# Patient Record
Sex: Female | Born: 1990 | Race: White | Hispanic: No | Marital: Single | Smoking: Never smoker
Health system: Southern US, Community
[De-identification: ages and names within clinical notes are randomized; demographics above are authoritative.]

## PROBLEM LIST (undated history)

## (undated) DIAGNOSIS — G43909 Migraine, unspecified, not intractable, without status migrainosus: Secondary | ICD-10-CM

## (undated) DIAGNOSIS — F419 Anxiety disorder, unspecified: Secondary | ICD-10-CM

## (undated) HISTORY — DX: Migraine, unspecified, not intractable, without status migrainosus: G43.909

## (undated) HISTORY — DX: Anxiety disorder, unspecified: F41.9

---

## 2008-08-08 ENCOUNTER — Ambulatory Visit: Payer: Self-pay | Admitting: Occupational Medicine

## 2008-08-08 DIAGNOSIS — S92309A Fracture of unspecified metatarsal bone(s), unspecified foot, initial encounter for closed fracture: Secondary | ICD-10-CM | POA: Insufficient documentation

## 2008-08-11 ENCOUNTER — Encounter (INDEPENDENT_AMBULATORY_CARE_PROVIDER_SITE_OTHER): Payer: Self-pay | Admitting: Occupational Medicine

## 2008-09-02 ENCOUNTER — Encounter: Admission: RE | Admit: 2008-09-02 | Discharge: 2008-09-02 | Payer: Self-pay | Admitting: Sports Medicine

## 2008-09-16 ENCOUNTER — Encounter: Admission: RE | Admit: 2008-09-16 | Discharge: 2008-09-16 | Payer: Self-pay | Admitting: Sports Medicine

## 2010-07-20 IMAGING — CR DG FOOT COMPLETE 3+V*L*
3 series · 3 of 3 positions shown · non-contrast
Comparison: [HOSPITAL] [HOSPITAL] left foot radiographs
08/08/2008 and 09/02/2008.

CLINICAL DATA: Follow-up left third fourth metatarsal fractures.

LEFT FOOT - COMPLETE 3+ VIEW

[view not recorded (1 of 3)]
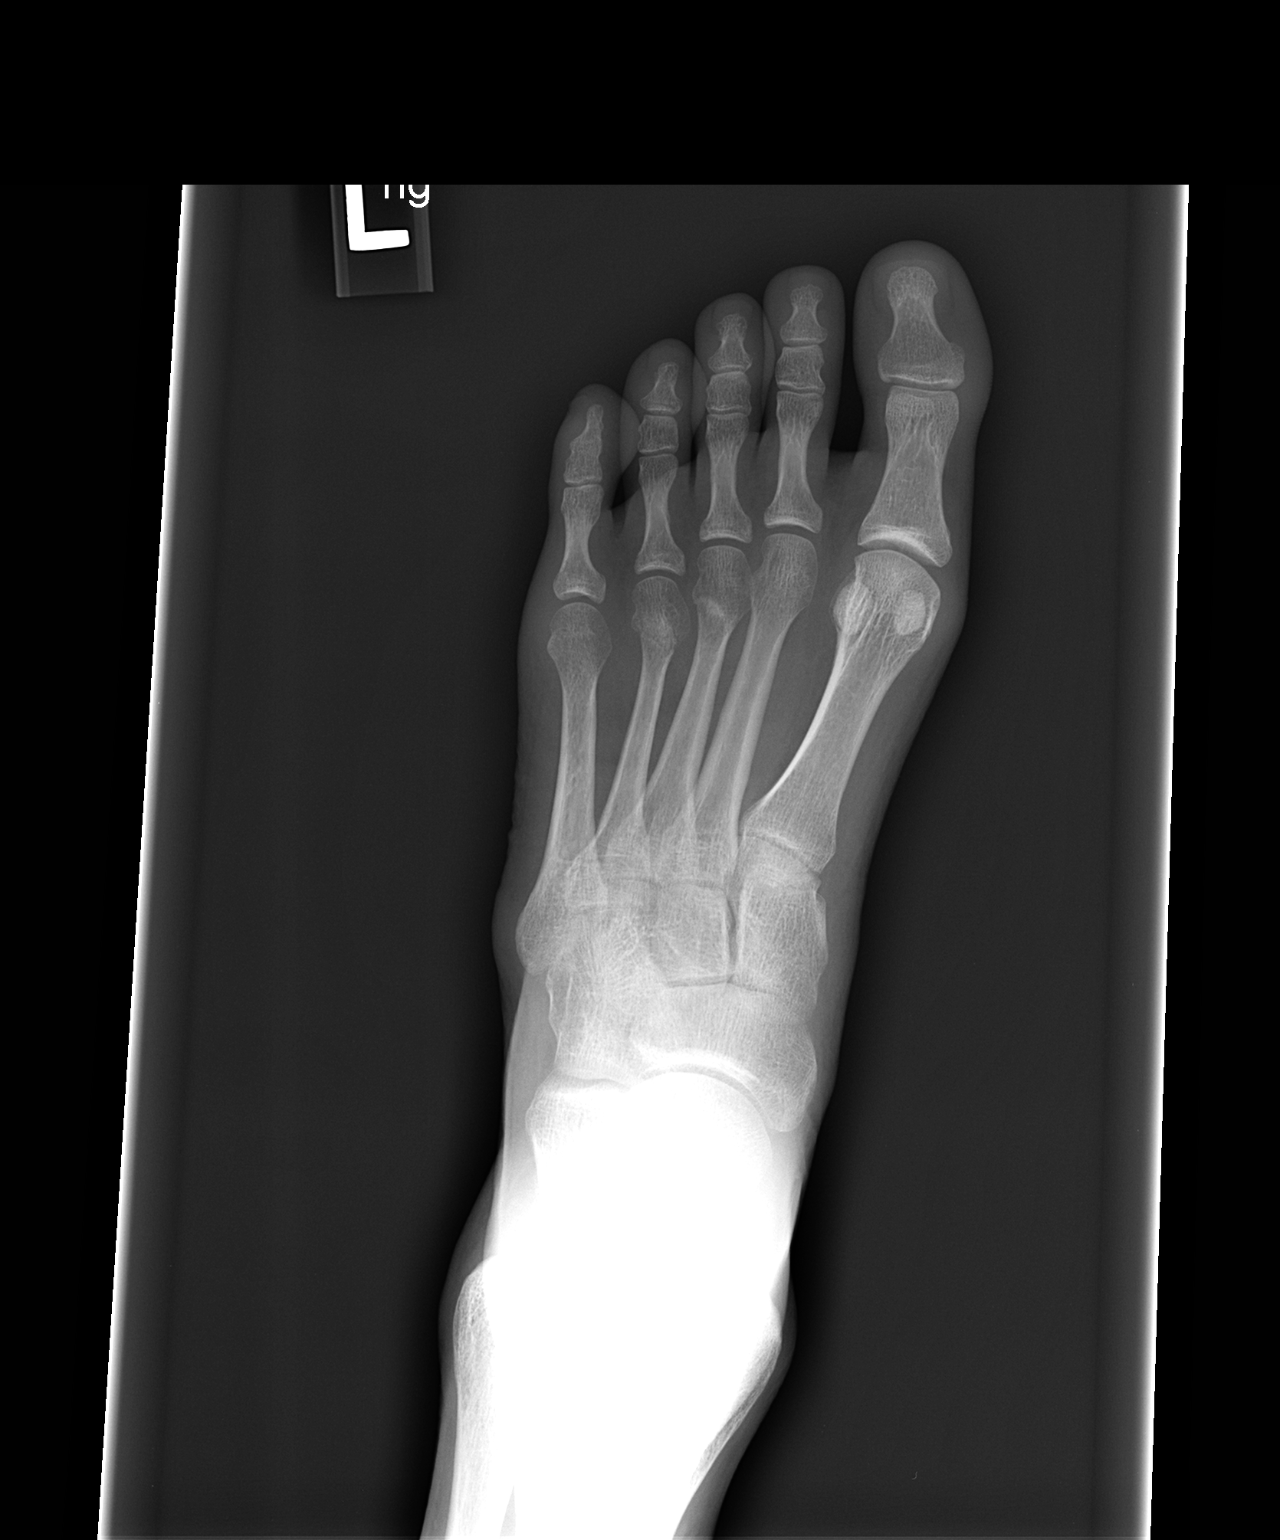

[view not recorded (2 of 3)]
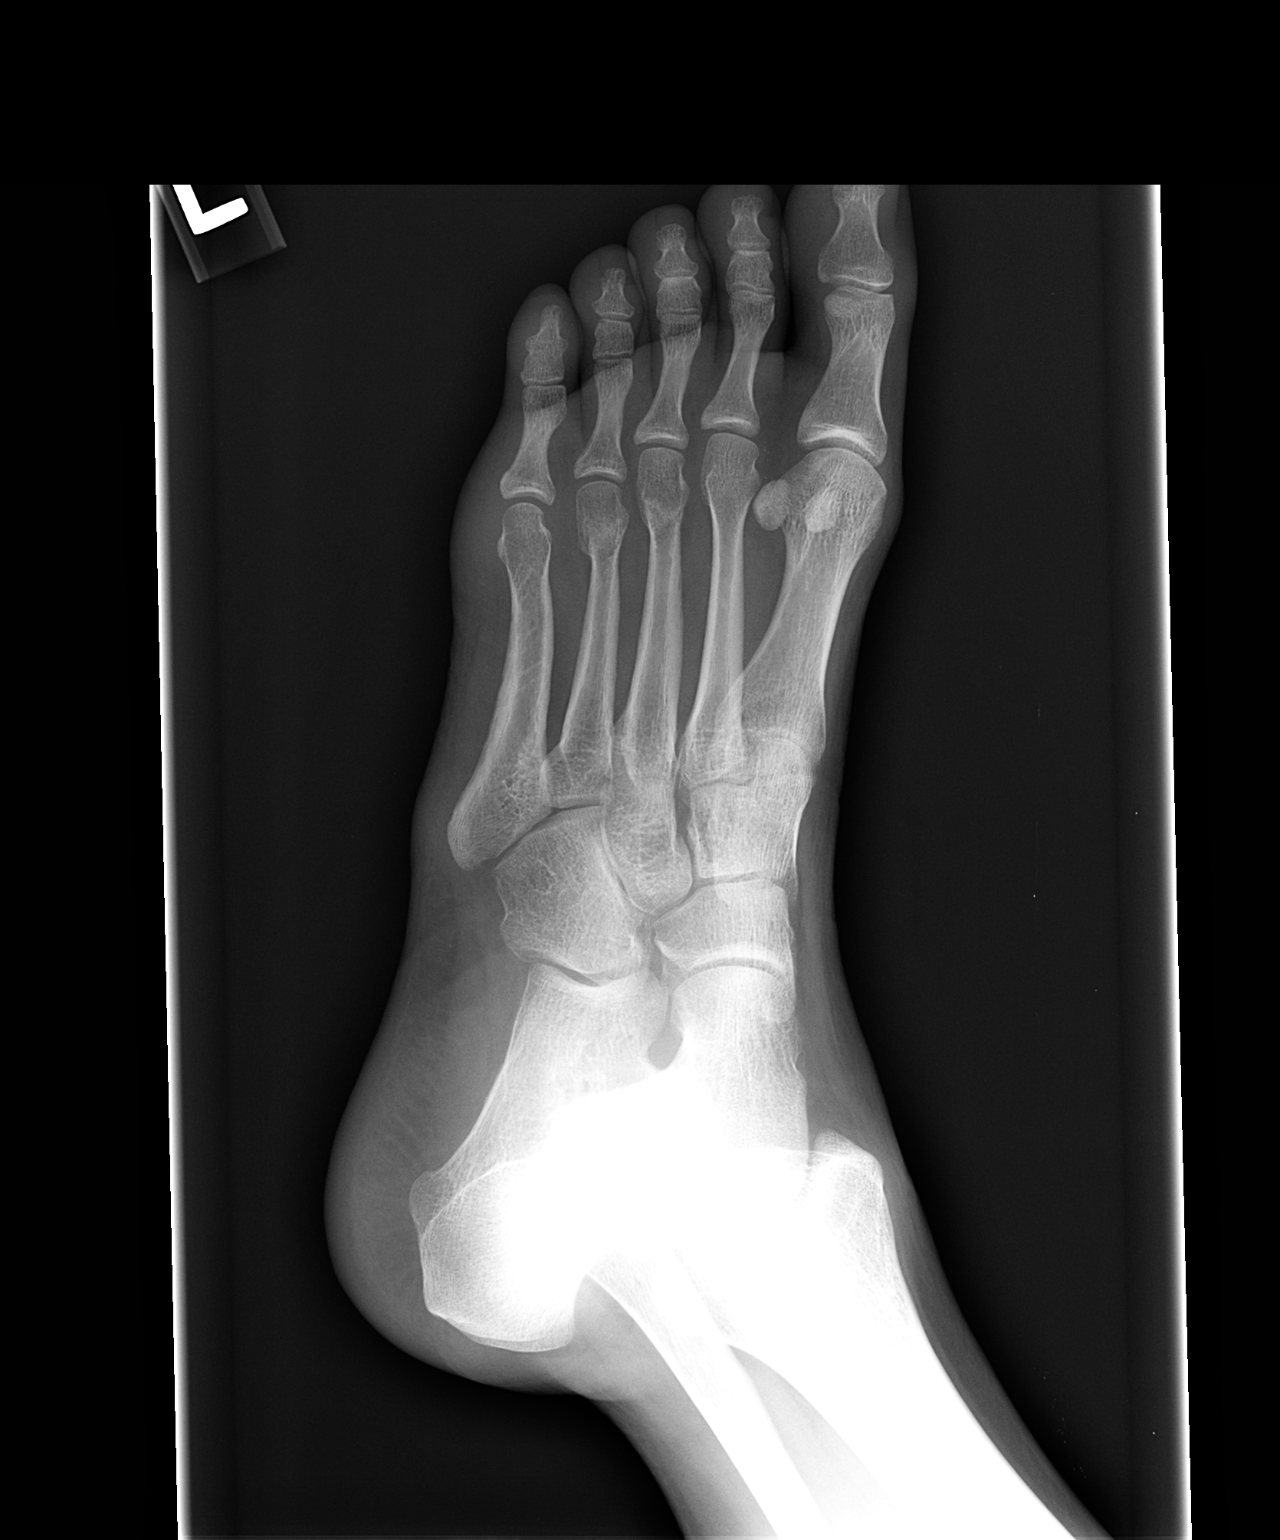

[view not recorded (3 of 3)]
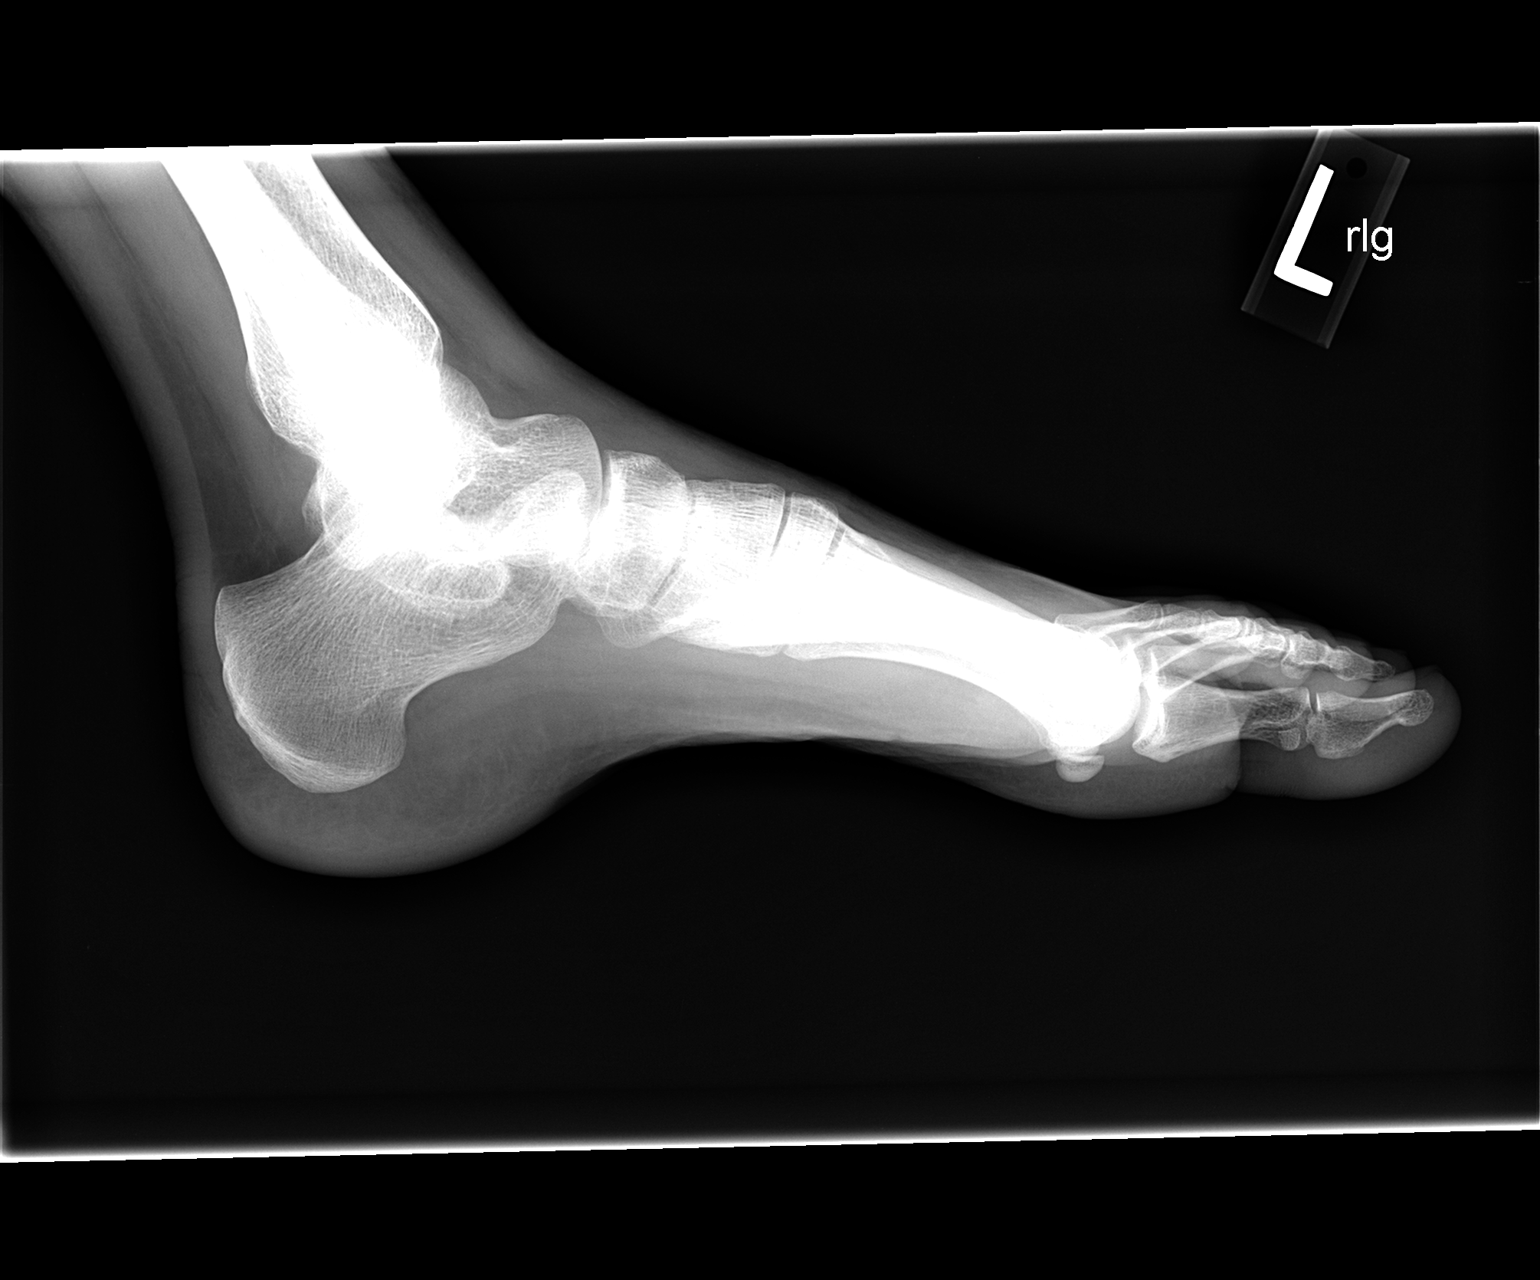

[3 of 3 positions shown; findings below may reference images not displayed]

FINDINGS: Since 09/02/2008 continued healing nondisplaced left
third and fourth metatarsal neck fractures visualized.  No new
significant abnormalities seen.
IMPRESSION: Continued healing nondisplaced left third fourth metatarsal neck
fractures.

## 2012-03-12 ENCOUNTER — Ambulatory Visit (HOSPITAL_COMMUNITY): Payer: Self-pay | Admitting: Psychiatry

## 2012-03-24 ENCOUNTER — Ambulatory Visit (HOSPITAL_COMMUNITY): Payer: Self-pay | Admitting: Psychiatry

## 2012-04-07 ENCOUNTER — Encounter (HOSPITAL_COMMUNITY): Payer: Self-pay | Admitting: Psychiatry

## 2012-04-07 ENCOUNTER — Ambulatory Visit (INDEPENDENT_AMBULATORY_CARE_PROVIDER_SITE_OTHER): Payer: BC Managed Care – PPO | Admitting: Psychiatry

## 2012-04-07 VITALS — BP 131/62 | HR 56 | Ht 69.0 in | Wt 115.0 lb

## 2012-04-07 DIAGNOSIS — F411 Generalized anxiety disorder: Secondary | ICD-10-CM
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
## 2012-04-11 ENCOUNTER — Encounter (HOSPITAL_COMMUNITY): Payer: Self-pay | Admitting: Psychiatry

## 2012-04-11 DIAGNOSIS — F411 Generalized anxiety disorder: Secondary | ICD-10-CM | POA: Insufficient documentation

## 2012-04-11 MED ORDER — ESCITALOPRAM OXALATE 10 MG PO TABS: ORAL_TABLET | ORAL | Status: DC

## 2012-05-05 ENCOUNTER — Ambulatory Visit (HOSPITAL_COMMUNITY): Payer: Self-pay | Admitting: Psychiatry

## 2012-06-10 ENCOUNTER — Encounter (HOSPITAL_COMMUNITY): Payer: Self-pay | Admitting: Psychiatry

## 2012-06-10 ENCOUNTER — Telehealth (HOSPITAL_COMMUNITY): Payer: Self-pay

## 2012-06-10 ENCOUNTER — Ambulatory Visit (INDEPENDENT_AMBULATORY_CARE_PROVIDER_SITE_OTHER): Payer: BC Managed Care – PPO | Admitting: Psychiatry

## 2012-06-10 VITALS — BP 116/74 | HR 64 | Ht 69.0 in | Wt 113.0 lb

## 2012-06-10 DIAGNOSIS — F411 Generalized anxiety disorder: Secondary | ICD-10-CM

## 2012-06-10 MED ORDER — HYDROXYZINE HCL 25 MG PO TABS: 25.0000 mg | ORAL_TABLET | Freq: Three times a day (TID) | ORAL | Status: DC | PRN

## 2012-06-10 MED ORDER — ESCITALOPRAM OXALATE 20 MG PO TABS: 20.0000 mg | ORAL_TABLET | Freq: Every day | ORAL | Status: DC

## 2012-06-10 NOTE — Telephone Encounter (Signed)
Called pharmacy.  Clarified prescription.
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
## 2012-06-24 ENCOUNTER — Ambulatory Visit (HOSPITAL_COMMUNITY): Payer: Self-pay | Admitting: Psychiatry

## 2014-02-16 ENCOUNTER — Ambulatory Visit (INDEPENDENT_AMBULATORY_CARE_PROVIDER_SITE_OTHER): Payer: BC Managed Care – PPO | Admitting: Psychiatry

## 2014-02-16 ENCOUNTER — Encounter (HOSPITAL_COMMUNITY): Payer: Self-pay | Admitting: Psychiatry

## 2014-02-16 ENCOUNTER — Encounter (INDEPENDENT_AMBULATORY_CARE_PROVIDER_SITE_OTHER): Payer: Self-pay

## 2014-02-16 VITALS — BP 109/81 | HR 78 | Ht 71.0 in | Wt 115.0 lb

## 2014-02-16 DIAGNOSIS — F410 Panic disorder [episodic paroxysmal anxiety] without agoraphobia: Secondary | ICD-10-CM

## 2014-02-16 DIAGNOSIS — F411 Generalized anxiety disorder: Secondary | ICD-10-CM

## 2014-02-16 MED ORDER — DULOXETINE HCL 30 MG PO CPEP: 30.0000 mg | ORAL_CAPSULE | Freq: Every day | ORAL | Status: DC

## 2014-02-16 MED ORDER — DULOXETINE HCL 30 MG PO CPEP
30.0000 mg | ORAL_CAPSULE | Freq: Two times a day (BID) | ORAL | Status: DC
Start: 2014-02-16 — End: 2014-03-08

## 2014-02-16 MED ORDER — CLONAZEPAM 0.5 MG PO TABS: 0.5000 mg | ORAL_TABLET | Freq: Every day | ORAL | Status: DC
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
## 2014-03-08 ENCOUNTER — Encounter (HOSPITAL_COMMUNITY): Payer: Self-pay | Admitting: Psychiatry

## 2014-03-08 ENCOUNTER — Ambulatory Visit (INDEPENDENT_AMBULATORY_CARE_PROVIDER_SITE_OTHER): Payer: BC Managed Care – PPO | Admitting: Psychiatry

## 2014-03-08 VITALS — BP 111/59 | HR 61 | Ht 71.0 in | Wt 117.0 lb

## 2014-03-08 DIAGNOSIS — F410 Panic disorder [episodic paroxysmal anxiety] without agoraphobia: Secondary | ICD-10-CM

## 2014-03-08 DIAGNOSIS — F411 Generalized anxiety disorder: Secondary | ICD-10-CM

## 2014-03-08 MED ORDER — DULOXETINE HCL 60 MG PO CPEP: 60.0000 mg | ORAL_CAPSULE | Freq: Every day | ORAL | Status: DC

## 2014-03-08 MED ORDER — CLONAZEPAM 0.5 MG PO TABS: 0.5000 mg | ORAL_TABLET | Freq: Every day | ORAL | Status: DC
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
## 2014-04-15 ENCOUNTER — Telehealth (HOSPITAL_COMMUNITY): Payer: Self-pay | Admitting: *Deleted

## 2014-04-15 NOTE — Telephone Encounter (Signed)
Pt needs script for Klonopin. Pt states she is out of medication. Pt has appt on 1/25. Please call pt once prescription is written [phone].

## 2014-04-20 ENCOUNTER — Other Ambulatory Visit (HOSPITAL_COMMUNITY): Payer: Self-pay | Admitting: *Deleted

## 2014-04-20 DIAGNOSIS — F410 Panic disorder [episodic paroxysmal anxiety] without agoraphobia: Secondary | ICD-10-CM

## 2014-04-20 DIAGNOSIS — F411 Generalized anxiety disorder: Secondary | ICD-10-CM

## 2014-04-20 MED ORDER — CLONAZEPAM 0.5 MG PO TABS: 0.5000 mg | ORAL_TABLET | Freq: Every day | ORAL | Status: DC

## 2014-04-20 NOTE — Telephone Encounter (Signed)
Prescription phoned into pharmacy. Per Dr. Gilmore LarocheAkhtar, pt is authorized for 1 refill Klonopin 0.5mg , qty 30. Pt has appt on 1/25.

## 2014-05-10 ENCOUNTER — Ambulatory Visit (INDEPENDENT_AMBULATORY_CARE_PROVIDER_SITE_OTHER): Payer: BLUE CROSS/BLUE SHIELD | Admitting: Psychiatry

## 2014-05-10 ENCOUNTER — Encounter (INDEPENDENT_AMBULATORY_CARE_PROVIDER_SITE_OTHER): Payer: Self-pay

## 2014-05-10 ENCOUNTER — Encounter (HOSPITAL_COMMUNITY): Payer: Self-pay | Admitting: Psychiatry

## 2014-05-10 VITALS — BP 123/77 | HR 65 | Ht 71.0 in | Wt 118.0 lb

## 2014-05-10 DIAGNOSIS — F411 Generalized anxiety disorder: Secondary | ICD-10-CM | POA: Diagnosis not present

## 2014-05-10 DIAGNOSIS — F410 Panic disorder [episodic paroxysmal anxiety] without agoraphobia: Secondary | ICD-10-CM

## 2014-05-10 MED ORDER — DULOXETINE HCL 30 MG PO CPEP: 30.0000 mg | ORAL_CAPSULE | Freq: Two times a day (BID) | ORAL | Status: DC

## 2014-05-10 MED ORDER — CLONAZEPAM 0.5 MG PO TABS: 0.5000 mg | ORAL_TABLET | Freq: Every day | ORAL | Status: DC
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
## 2014-05-31 ENCOUNTER — Ambulatory Visit (HOSPITAL_COMMUNITY): Payer: Self-pay | Admitting: Psychiatry

## 2014-06-07 ENCOUNTER — Encounter (HOSPITAL_COMMUNITY): Payer: Self-pay | Admitting: Psychiatry

## 2014-06-07 ENCOUNTER — Ambulatory Visit (INDEPENDENT_AMBULATORY_CARE_PROVIDER_SITE_OTHER): Payer: BLUE CROSS/BLUE SHIELD | Admitting: Psychiatry

## 2014-06-07 VITALS — BP 101/66 | HR 73 | Ht 71.0 in | Wt 118.0 lb

## 2014-06-07 DIAGNOSIS — F410 Panic disorder [episodic paroxysmal anxiety] without agoraphobia: Secondary | ICD-10-CM | POA: Diagnosis not present

## 2014-06-07 DIAGNOSIS — F411 Generalized anxiety disorder: Secondary | ICD-10-CM | POA: Diagnosis not present

## 2014-06-07 MED ORDER — CLONAZEPAM 0.5 MG PO TABS: 0.5000 mg | ORAL_TABLET | Freq: Every day | ORAL | Status: DC

## 2014-06-07 MED ORDER — DULOXETINE HCL 30 MG PO CPEP
30.0000 mg | ORAL_CAPSULE | Freq: Two times a day (BID) | ORAL | Status: DC
Start: 2014-06-07 — End: 2014-07-19
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
## 2014-07-19 ENCOUNTER — Encounter (HOSPITAL_COMMUNITY): Payer: Self-pay | Admitting: Psychiatry

## 2014-07-19 ENCOUNTER — Ambulatory Visit (INDEPENDENT_AMBULATORY_CARE_PROVIDER_SITE_OTHER): Payer: BLUE CROSS/BLUE SHIELD | Admitting: Psychiatry

## 2014-07-19 VITALS — BP 124/90 | HR 80 | Ht 71.0 in | Wt 117.0 lb

## 2014-07-19 DIAGNOSIS — F410 Panic disorder [episodic paroxysmal anxiety] without agoraphobia: Secondary | ICD-10-CM | POA: Diagnosis not present

## 2014-07-19 DIAGNOSIS — F411 Generalized anxiety disorder: Secondary | ICD-10-CM | POA: Diagnosis not present

## 2014-07-19 MED ORDER — DULOXETINE HCL 30 MG PO CPEP: ORAL_CAPSULE | ORAL | Status: DC

## 2014-07-19 MED ORDER — CLONAZEPAM 0.5 MG PO TABS: 0.5000 mg | ORAL_TABLET | Freq: Every day | ORAL | Status: DC
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
## 2014-08-16 ENCOUNTER — Ambulatory Visit (INDEPENDENT_AMBULATORY_CARE_PROVIDER_SITE_OTHER): Payer: BLUE CROSS/BLUE SHIELD | Admitting: Psychiatry

## 2014-08-16 ENCOUNTER — Encounter (HOSPITAL_COMMUNITY): Payer: Self-pay | Admitting: Psychiatry

## 2014-08-16 VITALS — BP 118/72 | HR 70 | Ht 71.0 in | Wt 118.0 lb

## 2014-08-16 DIAGNOSIS — F411 Generalized anxiety disorder: Secondary | ICD-10-CM | POA: Diagnosis not present

## 2014-08-16 DIAGNOSIS — F410 Panic disorder [episodic paroxysmal anxiety] without agoraphobia: Secondary | ICD-10-CM

## 2014-08-16 MED ORDER — ESCITALOPRAM OXALATE 10 MG PO TABS: 10.0000 mg | ORAL_TABLET | Freq: Every day | ORAL | Status: DC
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
## 2014-08-18 ENCOUNTER — Ambulatory Visit (HOSPITAL_COMMUNITY): Payer: Self-pay | Admitting: Licensed Clinical Social Worker

## 2014-09-01 ENCOUNTER — Telehealth (HOSPITAL_COMMUNITY): Payer: Self-pay | Admitting: *Deleted

## 2014-09-01 DIAGNOSIS — F411 Generalized anxiety disorder: Secondary | ICD-10-CM

## 2014-09-01 DIAGNOSIS — F410 Panic disorder [episodic paroxysmal anxiety] without agoraphobia: Secondary | ICD-10-CM

## 2014-09-01 NOTE — Telephone Encounter (Signed)
PT called for a refill for clonazepam (KLONOPIN) 0.5 MG tablet. Pt states she has been taking 2 tablets daily. Pt would like to speak to you about increasing her dosage. Please call to advise.

## 2014-09-02 MED ORDER — CLONAZEPAM 0.5 MG PO TABS: 0.5000 mg | ORAL_TABLET | Freq: Every day | ORAL | Status: DC

## 2014-09-02 MED ORDER — CLONAZEPAM 0.5 MG PO TABS: 0.5000 mg | ORAL_TABLET | Freq: Two times a day (BID) | ORAL | Status: DC

## 2014-09-02 NOTE — Telephone Encounter (Signed)
PT called for a refill for Klonopin 0.5mg . Pt states she has been taking 2 daily due to anxiety worsen.  Per Dr. Gilmore LarocheAkhtar, pt is authorized for a refill for Klonopin 0.5mg , qty 60. Please advise pt to take medication as written and pt must keep all follow up appt. Prescription was phoned into [facility] on [address] [place]). Called and informed pt of prescription status. Pt sates and shows understanding.

## 2014-09-06 ENCOUNTER — Ambulatory Visit (HOSPITAL_COMMUNITY): Payer: Self-pay | Admitting: Psychiatry

## 2014-09-06 ENCOUNTER — Ambulatory Visit (HOSPITAL_COMMUNITY): Payer: Self-pay | Admitting: Licensed Clinical Social Worker

## 2014-09-20 ENCOUNTER — Ambulatory Visit (INDEPENDENT_AMBULATORY_CARE_PROVIDER_SITE_OTHER): Payer: BLUE CROSS/BLUE SHIELD | Admitting: Psychiatry

## 2014-09-20 ENCOUNTER — Encounter (HOSPITAL_COMMUNITY): Payer: Self-pay | Admitting: Psychiatry

## 2014-09-20 ENCOUNTER — Encounter (INDEPENDENT_AMBULATORY_CARE_PROVIDER_SITE_OTHER): Payer: Self-pay

## 2014-09-20 VITALS — BP 127/89 | HR 80 | Ht 71.0 in | Wt 118.0 lb

## 2014-09-20 DIAGNOSIS — F411 Generalized anxiety disorder: Secondary | ICD-10-CM

## 2014-09-20 DIAGNOSIS — F410 Panic disorder [episodic paroxysmal anxiety] without agoraphobia: Secondary | ICD-10-CM

## 2014-09-20 DIAGNOSIS — F330 Major depressive disorder, recurrent, mild: Secondary | ICD-10-CM

## 2014-09-20 MED ORDER — CLONAZEPAM 0.5 MG PO TABS: 0.5000 mg | ORAL_TABLET | Freq: Two times a day (BID) | ORAL | Status: DC

## 2014-09-20 MED ORDER — ESCITALOPRAM OXALATE 20 MG PO TABS: 20.0000 mg | ORAL_TABLET | Freq: Every day | ORAL | Status: DC
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
## 2014-09-24 ENCOUNTER — Ambulatory Visit (HOSPITAL_COMMUNITY): Payer: Self-pay | Admitting: Licensed Clinical Social Worker

## 2014-10-13 ENCOUNTER — Telehealth (HOSPITAL_COMMUNITY): Payer: Self-pay

## 2014-10-13 NOTE — Telephone Encounter (Signed)
clonazePAM (KLONOPIN) 0.5 MG tablet escitalopram (LEXAPRO) 20 MG tablet  [facility] [address]   Dr. Lavella LemonsAkhtar's PT needs refills on both scripts. Please call pt when the Klonopin is ready.

## 2014-10-14 NOTE — Telephone Encounter (Signed)
[facility] Spoke with [place]. Per [place], pt refilled Klonopin on 6/20 and Lexapro on 6/6 w/ 1 additional refill. Called and informed pt it was too early for a refill request. Pt has a f/u appt on 7/14. Pt states and shows understanding.

## 2014-10-28 ENCOUNTER — Ambulatory Visit (INDEPENDENT_AMBULATORY_CARE_PROVIDER_SITE_OTHER): Payer: BLUE CROSS/BLUE SHIELD | Admitting: Psychiatry

## 2014-10-28 VITALS — BP 100/65 | HR 80 | Wt 120.0 lb

## 2014-10-28 DIAGNOSIS — F330 Major depressive disorder, recurrent, mild: Secondary | ICD-10-CM

## 2014-10-28 DIAGNOSIS — F410 Panic disorder [episodic paroxysmal anxiety] without agoraphobia: Secondary | ICD-10-CM

## 2014-10-28 DIAGNOSIS — F411 Generalized anxiety disorder: Secondary | ICD-10-CM | POA: Diagnosis not present

## 2014-10-28 MED ORDER — CLONAZEPAM 0.5 MG PO TABS: 0.5000 mg | ORAL_TABLET | Freq: Two times a day (BID) | ORAL | Status: DC

## 2014-10-28 MED ORDER — ESCITALOPRAM OXALATE 20 MG PO TABS: 20.0000 mg | ORAL_TABLET | Freq: Every day | ORAL | Status: DC
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
## 2014-11-08 ENCOUNTER — Ambulatory Visit (INDEPENDENT_AMBULATORY_CARE_PROVIDER_SITE_OTHER): Payer: BLUE CROSS/BLUE SHIELD | Admitting: Licensed Clinical Social Worker

## 2014-11-08 ENCOUNTER — Encounter (HOSPITAL_COMMUNITY): Payer: Self-pay | Admitting: Licensed Clinical Social Worker

## 2014-11-08 DIAGNOSIS — F410 Panic disorder [episodic paroxysmal anxiety] without agoraphobia: Secondary | ICD-10-CM

## 2014-11-08 DIAGNOSIS — F4310 Post-traumatic stress disorder, unspecified: Secondary | ICD-10-CM

## 2014-11-08 DIAGNOSIS — F341 Dysthymic disorder: Secondary | ICD-10-CM
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
## 2014-11-09 ENCOUNTER — Encounter (HOSPITAL_COMMUNITY): Payer: Self-pay | Admitting: Licensed Clinical Social Worker

## 2014-11-09 DIAGNOSIS — F4310 Post-traumatic stress disorder, unspecified: Secondary | ICD-10-CM | POA: Insufficient documentation

## 2014-11-09 DIAGNOSIS — F341 Dysthymic disorder: Secondary | ICD-10-CM | POA: Insufficient documentation

## 2014-11-23 ENCOUNTER — Ambulatory Visit (HOSPITAL_COMMUNITY): Payer: Self-pay | Admitting: Licensed Clinical Social Worker

## 2014-11-29 ENCOUNTER — Ambulatory Visit (HOSPITAL_COMMUNITY): Payer: Self-pay | Admitting: Licensed Clinical Social Worker

## 2014-12-06 ENCOUNTER — Ambulatory Visit (HOSPITAL_COMMUNITY): Payer: Self-pay | Admitting: Licensed Clinical Social Worker

## 2014-12-13 ENCOUNTER — Ambulatory Visit (INDEPENDENT_AMBULATORY_CARE_PROVIDER_SITE_OTHER): Payer: BLUE CROSS/BLUE SHIELD | Admitting: Licensed Clinical Social Worker

## 2014-12-13 DIAGNOSIS — F4310 Post-traumatic stress disorder, unspecified: Secondary | ICD-10-CM

## 2014-12-13 DIAGNOSIS — F341 Dysthymic disorder: Secondary | ICD-10-CM | POA: Diagnosis not present

## 2014-12-13 DIAGNOSIS — F410 Panic disorder [episodic paroxysmal anxiety] without agoraphobia: Secondary | ICD-10-CM | POA: Diagnosis not present

## 2014-12-13 NOTE — Progress Notes (Signed)
   THERAPIST PROGRESS NOTE  Session Time: 10:00am-11:05am  Participation Level: Active  Behavioral Response: Well GroomedAlertAnxious  Type of Therapy: Individual Therapy  Treatment Goals addressed: Reduce PTSD symptoms  Interventions: psychoeducation  Suicidal/Homicidal: Denied both  Therapist Interventions:  Gathered information about significant events since last seen at intake about a month ago. Had patient complete a PCL-C to assess for severity of PTSD symptoms.  Educated patient about symptoms of PTSD.  Emphasized that it is normal to have a variety of difficulties after experiencing a traumatic event.  Challenged patient to identify how she has continued to cope with her PTSD despite the symptoms.  Provided patient with a description of interventions she can expect in therapy.    Summary:  Reported she got engaged to her long time boyfriend. Got "bad news" about her mom getting evicted and being told that her MH issues are not likely to improve.  Also her fianc's grandfather is in hospice.   Score on the PCL-C was 68 which is indicative of the presence of PTSD.  Items she indicated she has been bothered by to an extreme amount in the past month were intrusive thoughts, nightmares,flashbacks, having physical reactions when reminded of traumatic events, avoidance of thoughts and feelings about those events, feeling detached from others, trouble falling and staying asleep, difficulty concentrating, and hypervigilence.  Indicated she was grateful for therapist taking time to review the symptoms.  Reported that she has been doing some reading about PTSD.  Was able to identify several ways she has been coping: Reminding herself that her painful feelings are temporary.  Calling someone supportive.  Reassuring herself that she can learn to cope more effectively by participating in therapy.      Plan: Has scheduled next therapy session for late September.  Will stress importance of  setting up appointments on a more consistent basis.    Diagnosis: PTSD                         Persistent Depressive Disorder                         Panic Disorder        Darrin Luis 12/13/2014

## 2014-12-23 ENCOUNTER — Ambulatory Visit (HOSPITAL_COMMUNITY): Payer: Self-pay | Admitting: Psychiatry

## 2015-01-04 ENCOUNTER — Encounter (HOSPITAL_COMMUNITY): Payer: Self-pay | Admitting: Psychiatry

## 2015-01-04 ENCOUNTER — Ambulatory Visit (INDEPENDENT_AMBULATORY_CARE_PROVIDER_SITE_OTHER): Payer: BLUE CROSS/BLUE SHIELD | Admitting: Psychiatry

## 2015-01-04 DIAGNOSIS — F411 Generalized anxiety disorder: Secondary | ICD-10-CM

## 2015-01-04 DIAGNOSIS — F330 Major depressive disorder, recurrent, mild: Secondary | ICD-10-CM

## 2015-01-04 DIAGNOSIS — F410 Panic disorder [episodic paroxysmal anxiety] without agoraphobia: Secondary | ICD-10-CM | POA: Diagnosis not present

## 2015-01-04 DIAGNOSIS — F4310 Post-traumatic stress disorder, unspecified: Secondary | ICD-10-CM

## 2015-01-04 MED ORDER — CLONAZEPAM 0.5 MG PO TABS: 0.5000 mg | ORAL_TABLET | Freq: Two times a day (BID) | ORAL | Status: DC

## 2015-01-04 MED ORDER — ESCITALOPRAM OXALATE 20 MG PO TABS: 30.0000 mg | ORAL_TABLET | Freq: Every day | ORAL | Status: DC

## 2015-01-04 MED ORDER — LAMOTRIGINE 25 MG PO TABS: 25.0000 mg | ORAL_TABLET | Freq: Every day | ORAL | Status: DC
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
## 2015-01-10 ENCOUNTER — Ambulatory Visit (HOSPITAL_COMMUNITY): Payer: Self-pay | Admitting: Licensed Clinical Social Worker

## 2015-01-17 ENCOUNTER — Ambulatory Visit (HOSPITAL_COMMUNITY): Payer: Self-pay | Admitting: Licensed Clinical Social Worker

## 2015-01-24 ENCOUNTER — Ambulatory Visit (HOSPITAL_COMMUNITY): Payer: Self-pay | Admitting: Licensed Clinical Social Worker

## 2015-01-25 ENCOUNTER — Other Ambulatory Visit (HOSPITAL_COMMUNITY): Payer: Self-pay | Admitting: Psychiatry

## 2015-01-26 NOTE — Telephone Encounter (Signed)
[facility] . Per Dr. Gilmore LarocheAkhtar, pt medication request is denied. It is too early to refill. Prescription for Lexapro 20mg , #30 was last filled on 01/04/15. Called and informed pt it was too early for a refill. Pt verbalizes understanding. Pt has a f/u appt on 01/31/15.

## 2015-01-31 ENCOUNTER — Ambulatory Visit (INDEPENDENT_AMBULATORY_CARE_PROVIDER_SITE_OTHER): Payer: BLUE CROSS/BLUE SHIELD | Admitting: Psychiatry

## 2015-01-31 ENCOUNTER — Encounter (HOSPITAL_COMMUNITY): Payer: Self-pay | Admitting: Psychiatry

## 2015-01-31 DIAGNOSIS — F410 Panic disorder [episodic paroxysmal anxiety] without agoraphobia: Secondary | ICD-10-CM | POA: Diagnosis not present

## 2015-01-31 DIAGNOSIS — F411 Generalized anxiety disorder: Secondary | ICD-10-CM

## 2015-01-31 DIAGNOSIS — F330 Major depressive disorder, recurrent, mild: Secondary | ICD-10-CM | POA: Diagnosis not present

## 2015-01-31 DIAGNOSIS — F4310 Post-traumatic stress disorder, unspecified: Secondary | ICD-10-CM | POA: Diagnosis not present

## 2015-01-31 MED ORDER — CLONAZEPAM 0.5 MG PO TABS: 0.5000 mg | ORAL_TABLET | Freq: Two times a day (BID) | ORAL | Status: DC

## 2015-01-31 MED ORDER — LAMOTRIGINE 100 MG PO TABS: 100.0000 mg | ORAL_TABLET | Freq: Every day | ORAL | Status: DC

## 2015-01-31 MED ORDER — ESCITALOPRAM OXALATE 20 MG PO TABS: 30.0000 mg | ORAL_TABLET | Freq: Every day | ORAL | Status: DC
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
## 2015-02-01 ENCOUNTER — Ambulatory Visit (INDEPENDENT_AMBULATORY_CARE_PROVIDER_SITE_OTHER): Payer: BLUE CROSS/BLUE SHIELD | Admitting: Licensed Clinical Social Worker

## 2015-02-01 DIAGNOSIS — F4310 Post-traumatic stress disorder, unspecified: Secondary | ICD-10-CM

## 2015-02-01 DIAGNOSIS — F410 Panic disorder [episodic paroxysmal anxiety] without agoraphobia: Secondary | ICD-10-CM | POA: Diagnosis not present
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
## 2015-02-07 ENCOUNTER — Ambulatory Visit (INDEPENDENT_AMBULATORY_CARE_PROVIDER_SITE_OTHER): Payer: BLUE CROSS/BLUE SHIELD | Admitting: Licensed Clinical Social Worker

## 2015-02-07 DIAGNOSIS — F410 Panic disorder [episodic paroxysmal anxiety] without agoraphobia: Secondary | ICD-10-CM | POA: Diagnosis not present

## 2015-02-07 DIAGNOSIS — F341 Dysthymic disorder: Secondary | ICD-10-CM

## 2015-02-07 DIAGNOSIS — F4310 Post-traumatic stress disorder, unspecified: Secondary | ICD-10-CM
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
## 2015-02-14 ENCOUNTER — Ambulatory Visit (HOSPITAL_COMMUNITY): Payer: Self-pay | Admitting: Licensed Clinical Social Worker

## 2015-03-03 ENCOUNTER — Encounter (HOSPITAL_COMMUNITY): Payer: Self-pay | Admitting: Psychiatry

## 2015-03-03 ENCOUNTER — Ambulatory Visit (INDEPENDENT_AMBULATORY_CARE_PROVIDER_SITE_OTHER): Payer: BLUE CROSS/BLUE SHIELD | Admitting: Psychiatry

## 2015-03-03 VITALS — BP 116/70 | HR 64 | Ht 71.0 in | Wt 119.0 lb

## 2015-03-03 DIAGNOSIS — F4310 Post-traumatic stress disorder, unspecified: Secondary | ICD-10-CM

## 2015-03-03 DIAGNOSIS — F410 Panic disorder [episodic paroxysmal anxiety] without agoraphobia: Secondary | ICD-10-CM

## 2015-03-03 DIAGNOSIS — F411 Generalized anxiety disorder: Secondary | ICD-10-CM | POA: Diagnosis not present

## 2015-03-03 DIAGNOSIS — F341 Dysthymic disorder: Secondary | ICD-10-CM

## 2015-03-03 DIAGNOSIS — F330 Major depressive disorder, recurrent, mild: Secondary | ICD-10-CM

## 2015-03-03 MED ORDER — CLONAZEPAM 0.5 MG PO TABS: 0.5000 mg | ORAL_TABLET | Freq: Two times a day (BID) | ORAL | Status: DC

## 2015-03-03 MED ORDER — LAMOTRIGINE 150 MG PO TABS: 150.0000 mg | ORAL_TABLET | Freq: Every day | ORAL | Status: DC
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
## 2015-03-21 ENCOUNTER — Ambulatory Visit (HOSPITAL_COMMUNITY): Payer: Self-pay | Admitting: Licensed Clinical Social Worker

## 2015-03-29 ENCOUNTER — Ambulatory Visit (HOSPITAL_COMMUNITY): Payer: BLUE CROSS/BLUE SHIELD | Admitting: Psychiatry

## 2015-03-30 ENCOUNTER — Ambulatory Visit (INDEPENDENT_AMBULATORY_CARE_PROVIDER_SITE_OTHER): Payer: BLUE CROSS/BLUE SHIELD | Admitting: Psychiatry

## 2015-03-30 ENCOUNTER — Encounter (HOSPITAL_COMMUNITY): Payer: Self-pay | Admitting: Psychiatry

## 2015-03-30 VITALS — BP 130/80 | HR 63 | Ht 71.0 in | Wt 124.0 lb

## 2015-03-30 DIAGNOSIS — F4310 Post-traumatic stress disorder, unspecified: Secondary | ICD-10-CM | POA: Diagnosis not present

## 2015-03-30 DIAGNOSIS — F410 Panic disorder [episodic paroxysmal anxiety] without agoraphobia: Secondary | ICD-10-CM | POA: Diagnosis not present

## 2015-03-30 DIAGNOSIS — F411 Generalized anxiety disorder: Secondary | ICD-10-CM

## 2015-03-30 DIAGNOSIS — F341 Dysthymic disorder: Secondary | ICD-10-CM | POA: Diagnosis not present

## 2015-03-30 MED ORDER — LAMOTRIGINE 150 MG PO TABS: 150.0000 mg | ORAL_TABLET | Freq: Every day | ORAL | Status: DC

## 2015-03-30 MED ORDER — CLONAZEPAM 0.5 MG PO TABS: 0.5000 mg | ORAL_TABLET | Freq: Two times a day (BID) | ORAL | Status: DC

## 2015-03-30 MED ORDER — ESCITALOPRAM OXALATE 20 MG PO TABS: 30.0000 mg | ORAL_TABLET | Freq: Every day | ORAL | Status: DC
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
## 2015-05-02 ENCOUNTER — Encounter (HOSPITAL_COMMUNITY): Payer: Self-pay | Admitting: Psychiatry

## 2015-05-02 ENCOUNTER — Ambulatory Visit (INDEPENDENT_AMBULATORY_CARE_PROVIDER_SITE_OTHER): Payer: BLUE CROSS/BLUE SHIELD | Admitting: Psychiatry

## 2015-05-02 VITALS — BP 107/75 | HR 74 | Wt 118.0 lb

## 2015-05-02 DIAGNOSIS — F410 Panic disorder [episodic paroxysmal anxiety] without agoraphobia: Secondary | ICD-10-CM | POA: Diagnosis not present

## 2015-05-02 DIAGNOSIS — F411 Generalized anxiety disorder: Secondary | ICD-10-CM | POA: Diagnosis not present

## 2015-05-02 MED ORDER — ESCITALOPRAM OXALATE 20 MG PO TABS: 30.0000 mg | ORAL_TABLET | Freq: Every day | ORAL | Status: DC

## 2015-05-02 MED ORDER — CLONAZEPAM 0.5 MG PO TABS
0.5000 mg | ORAL_TABLET | Freq: Two times a day (BID) | ORAL | Status: DC
Start: 2015-05-02 — End: 2015-07-01

## 2015-05-02 MED ORDER — LAMOTRIGINE 150 MG PO TABS: 150.0000 mg | ORAL_TABLET | Freq: Every day | ORAL | Status: DC
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
## 2015-05-30 ENCOUNTER — Ambulatory Visit (HOSPITAL_COMMUNITY): Payer: Self-pay | Admitting: Psychiatry

## 2015-05-31 ENCOUNTER — Ambulatory Visit (HOSPITAL_COMMUNITY): Payer: Self-pay | Admitting: Psychiatry

## 2015-07-01 ENCOUNTER — Ambulatory Visit (INDEPENDENT_AMBULATORY_CARE_PROVIDER_SITE_OTHER): Payer: Self-pay | Admitting: Psychiatry

## 2015-07-01 ENCOUNTER — Encounter (HOSPITAL_COMMUNITY): Payer: Self-pay | Admitting: Psychiatry

## 2015-07-01 VITALS — BP 124/70 | HR 62 | Ht 71.0 in | Wt 126.0 lb

## 2015-07-01 DIAGNOSIS — F410 Panic disorder [episodic paroxysmal anxiety] without agoraphobia: Secondary | ICD-10-CM

## 2015-07-01 DIAGNOSIS — F411 Generalized anxiety disorder: Secondary | ICD-10-CM

## 2015-07-01 DIAGNOSIS — F341 Dysthymic disorder: Secondary | ICD-10-CM

## 2015-07-01 DIAGNOSIS — F4310 Post-traumatic stress disorder, unspecified: Secondary | ICD-10-CM

## 2015-07-01 MED ORDER — ESCITALOPRAM OXALATE 20 MG PO TABS: 40.0000 mg | ORAL_TABLET | Freq: Every day | ORAL | Status: DC

## 2015-07-01 MED ORDER — CLONAZEPAM 0.5 MG PO TABS: 0.5000 mg | ORAL_TABLET | Freq: Two times a day (BID) | ORAL | Status: DC

## 2015-07-01 MED ORDER — LAMOTRIGINE 150 MG PO TABS: 150.0000 mg | ORAL_TABLET | Freq: Every day | ORAL | Status: DC
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
## 2015-07-07 ENCOUNTER — Encounter (HOSPITAL_COMMUNITY): Payer: Self-pay | Admitting: Psychiatry

## 2015-07-07 ENCOUNTER — Ambulatory Visit (INDEPENDENT_AMBULATORY_CARE_PROVIDER_SITE_OTHER): Payer: Self-pay | Admitting: Psychiatry

## 2015-07-07 VITALS — BP 128/70 | HR 69 | Ht 71.0 in | Wt 125.0 lb

## 2015-07-07 DIAGNOSIS — Z3490 Encounter for supervision of normal pregnancy, unspecified, unspecified trimester: Secondary | ICD-10-CM

## 2015-07-07 DIAGNOSIS — F4310 Post-traumatic stress disorder, unspecified: Secondary | ICD-10-CM

## 2015-07-07 DIAGNOSIS — Z331 Pregnant state, incidental: Secondary | ICD-10-CM

## 2015-07-07 DIAGNOSIS — F341 Dysthymic disorder: Secondary | ICD-10-CM

## 2015-07-07 DIAGNOSIS — F410 Panic disorder [episodic paroxysmal anxiety] without agoraphobia: Secondary | ICD-10-CM

## 2015-07-07 DIAGNOSIS — F411 Generalized anxiety disorder: Secondary | ICD-10-CM
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
## 2015-08-01 ENCOUNTER — Ambulatory Visit (HOSPITAL_COMMUNITY): Payer: Self-pay | Admitting: Psychiatry

## 2017-02-25 ENCOUNTER — Other Ambulatory Visit: Payer: Self-pay

## 2017-02-25 ENCOUNTER — Ambulatory Visit (INDEPENDENT_AMBULATORY_CARE_PROVIDER_SITE_OTHER): Payer: BLUE CROSS/BLUE SHIELD | Admitting: Psychiatry

## 2017-02-25 ENCOUNTER — Encounter (HOSPITAL_COMMUNITY): Payer: Self-pay | Admitting: Psychiatry

## 2017-02-25 VITALS — BP 120/72 | HR 72 | Resp 16 | Ht 71.0 in | Wt 151.0 lb

## 2017-02-25 DIAGNOSIS — F411 Generalized anxiety disorder: Secondary | ICD-10-CM

## 2017-02-25 DIAGNOSIS — F410 Panic disorder [episodic paroxysmal anxiety] without agoraphobia: Secondary | ICD-10-CM | POA: Diagnosis not present

## 2017-02-25 MED ORDER — BUSPIRONE HCL 15 MG PO TABS: 7.5000 mg | ORAL_TABLET | Freq: Every day | ORAL | Status: DC | PRN

## 2017-02-25 MED ORDER — ESCITALOPRAM OXALATE 5 MG PO TABS: 5.0000 mg | ORAL_TABLET | Freq: Every day | ORAL | 0 refills | Status: DC

## 2017-02-25 NOTE — Progress Notes (Signed)
BH MD/PA/NP OP Progress Note  02/25/2017 10:44 AM Amanda Singleton  MRN:  [id number]  Chief Complaint:  Chief Complaint    Follow-up; Anxiety     HPI: 26 years old Postpartum 1 years. Married returns with baby  After 1.4 years diagnosed with OCD  Panic disorder and anxiety . Mood symptoms in past  States she stopped medication she got pregnant postpartum time was reasonable as well she is much attached the baby still breast-feeding with recent days noticing anxiety at times panic attacks OCD symptoms include excessive cleanliness and taking care of things that should be in order she does have a lot of visitors in the family and she feels that she is overwhelmed and at times she has feeling of panic symptoms extreme anxiety and excessive worries. She is having difficulty telling the family members to give her some time and privacy. Babies birthday is coming in as well increased stress and issues with her husband's fair to good she mostly tried to take care of the baby herself has not let the baby being taken care of her other family members as of now.  Aggravating factor: most time spent with baby. Overwhelmed when family comes frequent visit Modifying factor: husband. She is home maker for now  Sleep fair Anxiety worse Depression fluctuates  Visit Diagnosis:    ICD-10-CM   1. Generalized anxiety disorder F41.1   2. Panic disorder F41.0     Past Psychiatric History: anxiety and mood symptoms  Past Medical History:  Past Medical History:  Diagnosis Date  . Anxiety   . Migraines    History reviewed. No pertinent surgical history.  Family Psychiatric History: bipolar  Family History:  Family History  Problem Relation Age of Onset  . Bipolar disorder Mother   . Alcohol abuse Father   . Heart attack Father   . Cancer Other   . Hypertension Maternal Grandfather   . Alcohol abuse Maternal Grandfather   . Hypertension Maternal Grandmother     Social History:  Social History    Socioeconomic History  . Marital status: Single    Spouse name: None  . Number of children: None  . Years of education: None  . Highest education level: None  Social Needs  . Financial resource strain: None  . Food insecurity - worry: None  . Food insecurity - inability: None  . Transportation needs - medical: None  . Transportation needs - non-medical: None  Occupational History  . None  Tobacco Use  . Smoking status: Never Smoker  . Smokeless tobacco: Never Used  Substance and Sexual Activity  . Alcohol use: No    Alcohol/week: 0.5 oz    Types: 1 Standard drinks or equivalent per week  . Drug use: No    Comment: The patient has used in the past 3 years  . Sexual activity: Yes    Partners: Male  Other Topics Concern  . None  Social History Narrative  . None    Allergies:  Allergies  Allergen Reactions  . Shellfish Allergy Rash and Swelling    Metabolic Disorder Labs: No results found for: HGBA1C, MPG No results found for: PROLACTIN No results found for: CHOL, TRIG, HDL, CHOLHDL, VLDL, LDLCALC No results found for: TSH  Therapeutic Level Labs: No results found for: LITHIUM No results found for: VALPROATE No components found for:  CBMZ  Current Medications: Current Outpatient Medications  Medication Sig Dispense Refill  . escitalopram (LEXAPRO) 5 MG tablet Take 1 tablet (5  mg total) daily by mouth. 30 tablet 0  . methocarbamol (ROBAXIN) 750 MG tablet Reported on 07/01/2015  0   Current Facility-Administered Medications  Medication Dose Route Frequency Provider Last Rate Last Dose  . busPIRone (BUSPAR) tablet 7.5 mg  7.5 mg Oral Daily PRN Thresa RossAkhtar, Chantavia Bazzle, MD         Musculoskeletal: Strength & Muscle Tone: within normal limits Gait & Station: normal Patient leans: no lean  Psychiatric Specialty Exam: Review of Systems  Cardiovascular: Negative for chest pain.  Skin: Negative for rash.  Psychiatric/Behavioral: Negative for substance abuse. The  patient is nervous/anxious.     Blood pressure 120/72, pulse 72, resp. rate 16, height 5\' 11"  (1.803 m), weight 151 lb (68.5 kg), SpO2 93 %.Body mass index is 21.06 kg/m.  General Appearance: Casual  Eye Contact:  Fair  Speech:  Normal Rate  Volume:  Normal  Mood:  Anxious  Affect:  Congruent  Thought Process:  Goal Directed  Orientation:  Full (Time, Place, and Person)  Thought Content: Rumination   Suicidal Thoughts:  No  Homicidal Thoughts:  No  Memory:  Immediate;   Fair Recent;   Fair  Judgement:  Fair  Insight:  Fair  Psychomotor Activity:  Normal  Concentration:  Concentration: Fair and Attention Span: Fair  Recall:  [employer]  Fund of Knowledge: Fair  Language: Fair  Akathisia:  Negative  Handed:  Right  AIMS (if indicated): not done  Assets:  Desire for Improvement  ADL's:  Intact  Cognition: WNL  Sleep:  Fair   Screenings: PHQ2-9     Counselor from 11/08/2014 in [facility] AT [place]  PHQ-2 Total Score  5  PHQ-9 Total Score  19       Assessment and Plan: GAD: she is breast feeding reviewed concers. wil start 5mg  lexapro. Explained some amount may go thru baby and its effect.  Panic disorder: lexapro as above Also wants to have prn med. Buspirone 7.5mg  qd prn Effects on baby and concerns explained  She prefers prn med but understand it may or may not work on panic attacks Provided supportive therapy Reviewed concerns and also to talk with family to give her more time and less frequent visits as it effects her stress level No suicidal or psychotic symptoms FU 3-4 weeks   Thresa RossAKHTAR, Cherylann Hobday, MD 02/25/2017, 10:44 AM

## 2017-02-26 ENCOUNTER — Telehealth (HOSPITAL_COMMUNITY): Payer: Self-pay | Admitting: *Deleted

## 2017-02-26 MED ORDER — BUSPIRONE HCL 7.5 MG PO TABS: 7.5000 mg | ORAL_TABLET | Freq: Every day | ORAL | 0 refills | Status: DC

## 2017-02-26 NOTE — Telephone Encounter (Signed)
Medication refill- pt called office requesting a refill for Buspar. Pt was seen in office on 02/26/17. [facility]. Per Mare FerrariJoe Buspar rx was not received, a new rx will need to be sent to pharmacy.   Per Dr. Gilmore LarocheAkhtar, please send a new rx for Buspar 7.5mg , #30. Rx was sent electronically. Pt's next apt is schedule on 03/21/17. Called and informed pt of refill status/ pt verbalizes understanding.

## 2017-03-21 ENCOUNTER — Ambulatory Visit (HOSPITAL_COMMUNITY): Payer: Self-pay | Admitting: Psychiatry

## 2017-03-26 ENCOUNTER — Ambulatory Visit (HOSPITAL_COMMUNITY): Payer: Self-pay | Admitting: Psychiatry

## 2017-03-28 ENCOUNTER — Other Ambulatory Visit (HOSPITAL_COMMUNITY): Payer: Self-pay | Admitting: Psychiatry

## 2017-04-02 ENCOUNTER — Telehealth (HOSPITAL_COMMUNITY): Payer: Self-pay

## 2017-04-02 NOTE — Telephone Encounter (Signed)
Can wait till tomorrow appointment

## 2017-04-02 NOTE — Telephone Encounter (Signed)
Pt called again to check the status of getting the refills today.   Please advise.

## 2017-04-02 NOTE — Telephone Encounter (Signed)
Tried calling patient back to let her know that she has to wait until her appointment time tomorrow to get any refills. Did not get an answer. Could not leave VM because it was not set up yet. Nothing further is needed at this time.

## 2017-04-02 NOTE — Telephone Encounter (Signed)
Patient knows she has an appointment tomorrow and states she has been out of her medications for 5 days. Wants to know if Dr. Gilmore LarocheAkhtar can send in refill. Please review and advise.

## 2017-04-03 ENCOUNTER — Other Ambulatory Visit: Payer: Self-pay

## 2017-04-03 ENCOUNTER — Ambulatory Visit (INDEPENDENT_AMBULATORY_CARE_PROVIDER_SITE_OTHER): Payer: BLUE CROSS/BLUE SHIELD | Admitting: Psychiatry

## 2017-04-03 ENCOUNTER — Encounter (HOSPITAL_COMMUNITY): Payer: Self-pay | Admitting: Psychiatry

## 2017-04-03 VITALS — BP 110/78 | HR 73 | Ht 71.0 in | Wt 150.0 lb

## 2017-04-03 DIAGNOSIS — F411 Generalized anxiety disorder: Secondary | ICD-10-CM

## 2017-04-03 DIAGNOSIS — Z79899 Other long term (current) drug therapy: Secondary | ICD-10-CM

## 2017-04-03 DIAGNOSIS — F410 Panic disorder [episodic paroxysmal anxiety] without agoraphobia: Secondary | ICD-10-CM | POA: Diagnosis not present

## 2017-04-03 DIAGNOSIS — F4310 Post-traumatic stress disorder, unspecified: Secondary | ICD-10-CM | POA: Diagnosis not present

## 2017-04-03 DIAGNOSIS — F341 Dysthymic disorder: Secondary | ICD-10-CM | POA: Diagnosis not present

## 2017-04-03 DIAGNOSIS — Z811 Family history of alcohol abuse and dependence: Secondary | ICD-10-CM

## 2017-04-03 DIAGNOSIS — Z818 Family history of other mental and behavioral disorders: Secondary | ICD-10-CM

## 2017-04-03 MED ORDER — ESCITALOPRAM OXALATE 10 MG PO TABS: 10.0000 mg | ORAL_TABLET | Freq: Every day | ORAL | 0 refills | Status: AC

## 2017-04-03 MED ORDER — BUSPIRONE HCL 7.5 MG PO TABS: 7.5000 mg | ORAL_TABLET | Freq: Every day | ORAL | 0 refills | Status: AC

## 2017-04-03 NOTE — Progress Notes (Signed)
BH MD/PA/NP OP Progress Note  04/03/2017 9:08 AM Amanda Singleton  MRN:  [id number]  Chief Complaint:  Chief Complaint    Follow-up; Anxiety     HPI: 26 years old Postpartum 1 years. Married returns with baby  Returns for fu has baby one year. lexapro helped she ran low and feeling anxious, stressed again.  Family stress and difficult when all family at home or trying to do frequent visit to help her or the baby    Aggravating factor: most time spent with baby.  Family visits Modifying factor: husband  Timing: more in the morning Duration: postpartum one year  Also has infrequent panic attacks.   Visit Diagnosis:    ICD-10-CM   1. Generalized anxiety disorder F41.1   2. Panic disorder F41.0   3. PTSD (post-traumatic stress disorder) F43.10   4. Dysthymic disorder F34.1     Past Psychiatric History: anxiety and mood symptoms  Past Medical History:  Past Medical History:  Diagnosis Date  . Anxiety   . Migraines    History reviewed. No pertinent surgical history.  Family Psychiatric History: bipolar  Family History:  Family History  Problem Relation Age of Onset  . Bipolar disorder Mother   . Alcohol abuse Father   . Heart attack Father   . Cancer Other   . Hypertension Maternal Grandfather   . Alcohol abuse Maternal Grandfather   . Hypertension Maternal Grandmother     Social History:  Social History   Socioeconomic History  . Marital status: Single    Spouse name: None  . Number of children: None  . Years of education: None  . Highest education level: None  Social Needs  . Financial resource strain: None  . Food insecurity - worry: None  . Food insecurity - inability: None  . Transportation needs - medical: None  . Transportation needs - non-medical: None  Occupational History  . None  Tobacco Use  . Smoking status: Never Smoker  . Smokeless tobacco: Never Used  Substance and Sexual Activity  . Alcohol use: No    Alcohol/week: 0.5 oz   Types: 1 Standard drinks or equivalent per week  . Drug use: No    Comment: The patient has used in the past 3 years  . Sexual activity: Yes    Partners: Male  Other Topics Concern  . None  Social History Narrative  . None    Allergies:  Allergies  Allergen Reactions  . Shellfish Allergy Rash and Swelling    Metabolic Disorder Labs: No results found for: HGBA1C, MPG No results found for: PROLACTIN No results found for: CHOL, TRIG, HDL, CHOLHDL, VLDL, LDLCALC No results found for: TSH  Therapeutic Level Labs: No results found for: LITHIUM No results found for: VALPROATE No components found for:  CBMZ  Current Medications: Current Outpatient Medications  Medication Sig Dispense Refill  . busPIRone (BUSPAR) 7.5 MG tablet Take 1 tablet (7.5 mg total) by mouth daily. 30 tablet 0  . escitalopram (LEXAPRO) 10 MG tablet Take 1 tablet (10 mg total) by mouth daily. Start with half tablet a day for one week and then one a day 30 tablet 0  . methocarbamol (ROBAXIN) 750 MG tablet Reported on 07/01/2015  0   No current facility-administered medications for this visit.      Musculoskeletal: Strength & Muscle Tone: within normal limits Gait & Station: normal Patient leans: no lean  Psychiatric Specialty Exam: Review of Systems  Cardiovascular: Negative for palpitations.  Skin:  Negative for rash.  Psychiatric/Behavioral: Negative for substance abuse. The patient is nervous/anxious.     Blood pressure 110/78, pulse 73, height 5\' 11"  (1.803 m), weight 150 lb (68 kg), unknown if currently breastfeeding.Body mass index is 20.92 kg/m.  General Appearance: Casual  Eye Contact:  Fair  Speech:  Normal Rate  Volume:  Normal  Mood:  Somewhat stressed  Affect:  congruent  Thought Process:  Goal Directed  Orientation:  Full (Time, Place, and Person)  Thought Content: Rumination   Suicidal Thoughts:  No  Homicidal Thoughts:  No  Memory:  Immediate;   Fair Recent;   Fair  Judgement:   Fair  Insight:  Fair  Psychomotor Activity:  Normal  Concentration:  Concentration: Fair and Attention Span: Fair  Recall:  [employer]  Fund of Knowledge: Fair  Language: Fair  Akathisia:  Negative  Handed:  Right  AIMS (if indicated): not done  Assets:  Desire for Improvement  ADL's:  Intact  Cognition: WNL  Sleep:  Fair   Screenings: PHQ2-9     Counselor from 11/08/2014 in [facility] AT [place]  PHQ-2 Total Score  5  PHQ-9 Total Score  19       Assessment and Plan: GAD: not regularly breast feeding. Restart lexapro but increase to 10mg  inone week Continue buspar small dose  Panic disorder:infrequent. Restart lexapro Again was wanting some prn meds. But understand lexapro helps and we need to build that up.   Questions addressed  FU 3-4 weeks   Thresa RossNadeem Ellanora Rayborn, MD 04/03/2017, 9:08 AM

## 2017-04-24 ENCOUNTER — Ambulatory Visit (HOSPITAL_COMMUNITY): Payer: Self-pay | Admitting: Psychiatry
